# Patient Record
Sex: Female | Born: 1960 | Race: White | Hispanic: No | Marital: Single | Smoking: Current every day smoker
Health system: Southern US, Community
[De-identification: ages and names within clinical notes are randomized; demographics above are authoritative.]

## PROBLEM LIST (undated history)

## (undated) DIAGNOSIS — K743 Primary biliary cirrhosis: Secondary | ICD-10-CM

## (undated) DIAGNOSIS — M358 Other specified systemic involvement of connective tissue: Secondary | ICD-10-CM

## (undated) HISTORY — PX: ANGIOPLASTY: SHX39

---

## 2016-08-07 ENCOUNTER — Ambulatory Visit (INDEPENDENT_AMBULATORY_CARE_PROVIDER_SITE_OTHER): Payer: Medicare Other

## 2016-08-07 ENCOUNTER — Encounter (HOSPITAL_COMMUNITY): Payer: Self-pay | Admitting: Emergency Medicine

## 2016-08-07 ENCOUNTER — Ambulatory Visit (HOSPITAL_COMMUNITY)
Admission: EM | Admit: 2016-08-07 | Discharge: 2016-08-07 | Disposition: A | Discharge status: Home / Self Care | Payer: Medicare Other | Attending: Family Medicine | Admitting: Family Medicine

## 2016-08-07 DIAGNOSIS — S2232XA Fracture of one rib, left side, initial encounter for closed fracture: Secondary | ICD-10-CM

## 2016-08-07 HISTORY — DX: Other specified systemic involvement of connective tissue: M35.8

## 2016-08-07 HISTORY — DX: Primary biliary cirrhosis: K74.3

## 2016-08-07 MED ORDER — DICLOFENAC POTASSIUM 50 MG PO TABS: 50.0000 mg | ORAL_TABLET | Freq: Three times a day (TID) | ORAL | 0 refills | Status: AC

## 2016-08-07 NOTE — Discharge Instructions (Signed)
Use heat and medicine as needed, see your doctor if further problems. °
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
